# Patient Record
Sex: Female | Born: 1995 | Race: White | Hispanic: No | Marital: Single | Smoking: Never smoker
Health system: Southern US, Community
[De-identification: ages and names within clinical notes are randomized; demographics above are authoritative.]

## PROBLEM LIST (undated history)

## (undated) DIAGNOSIS — A281 Cat-scratch disease: Secondary | ICD-10-CM

## (undated) DIAGNOSIS — J45909 Unspecified asthma, uncomplicated: Secondary | ICD-10-CM

---
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
## 2016-10-23 IMAGING — CR DG CHEST 2V
1 series · 2 of 2 positions shown · non-contrast
Comparison: None.

CLINICAL DATA: Central chest pain

EXAM:
CHEST  2 VIEW

[Series 1: dg chest 2 view · 0.14mm/px · 2 of 2 slices shown]
[im 1/2]
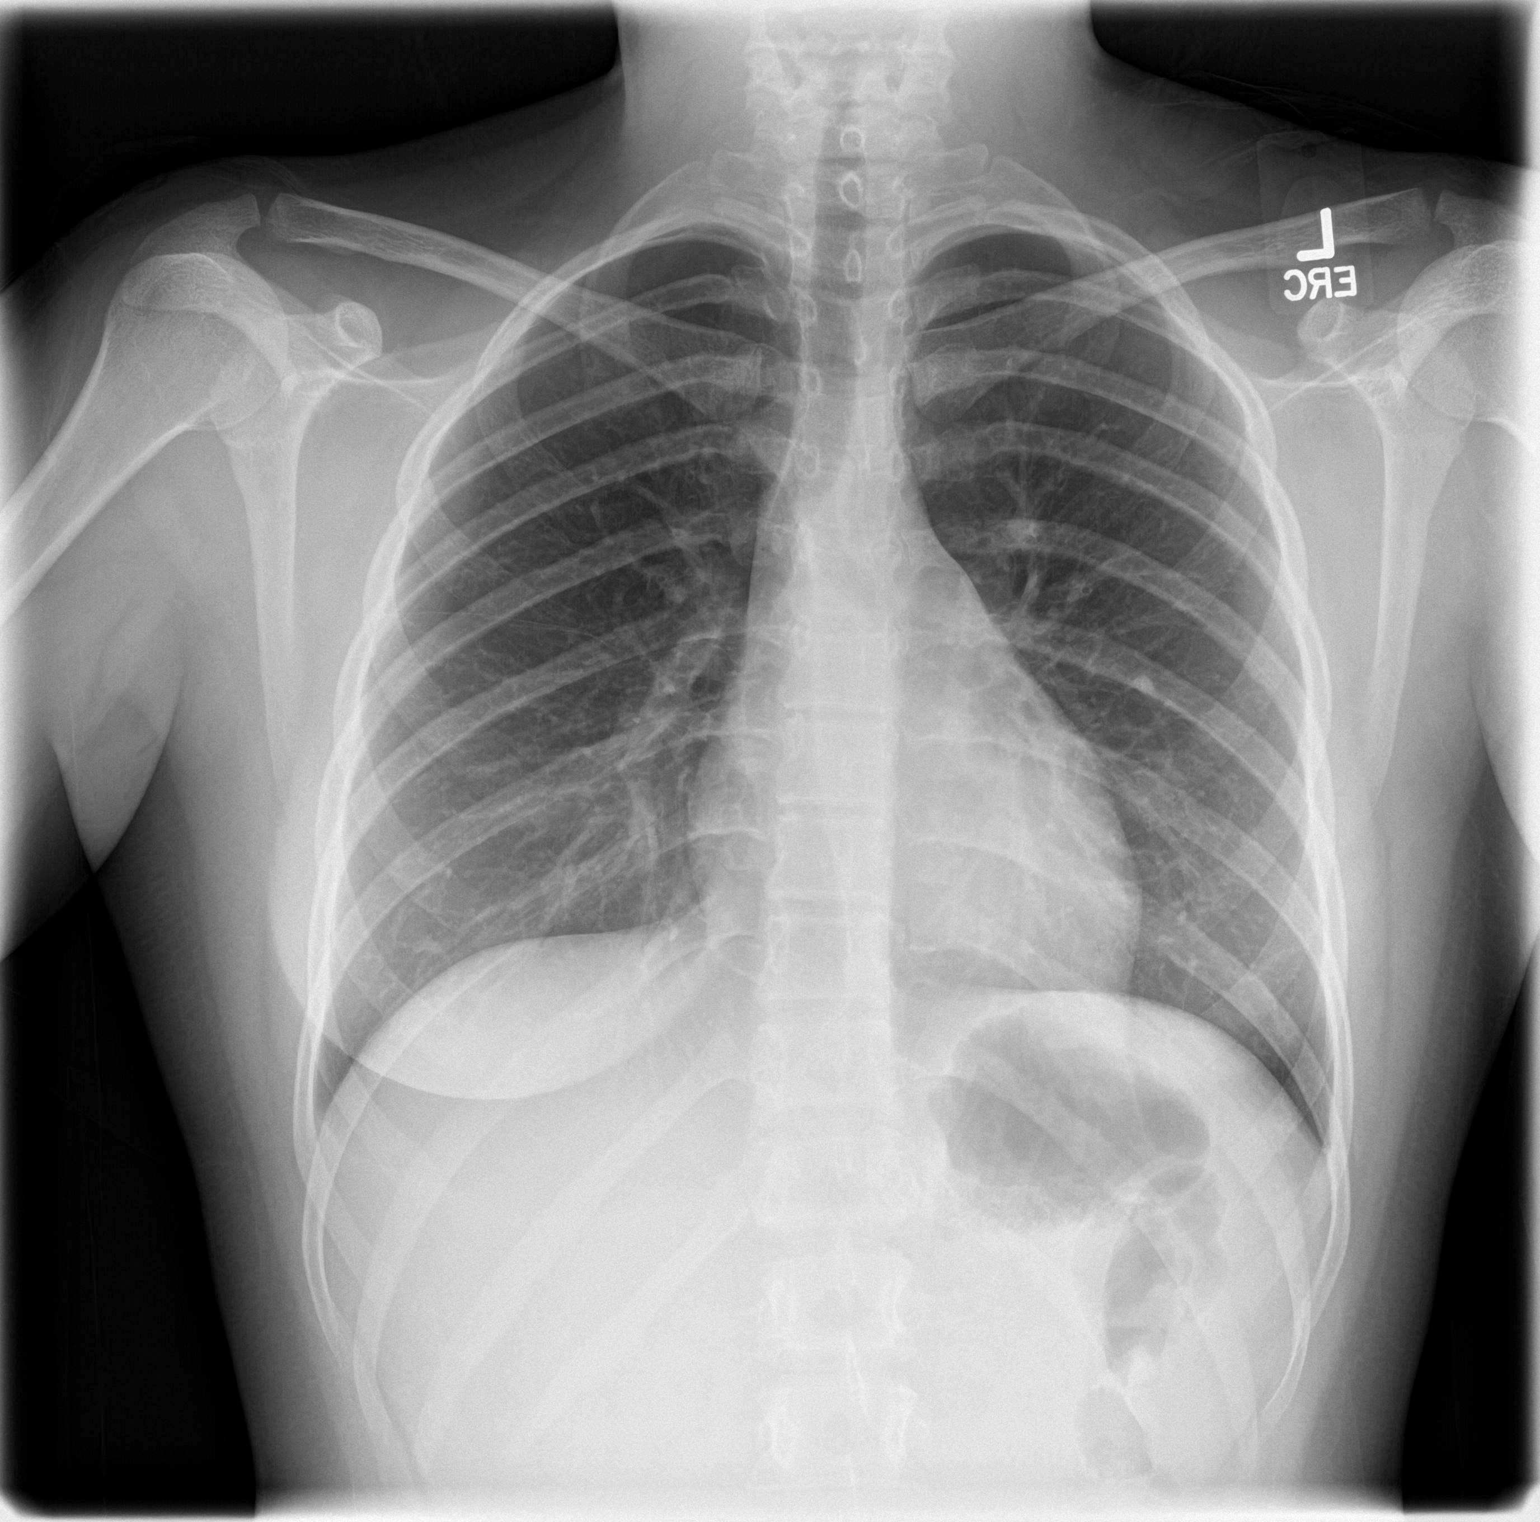
[im 2/2]
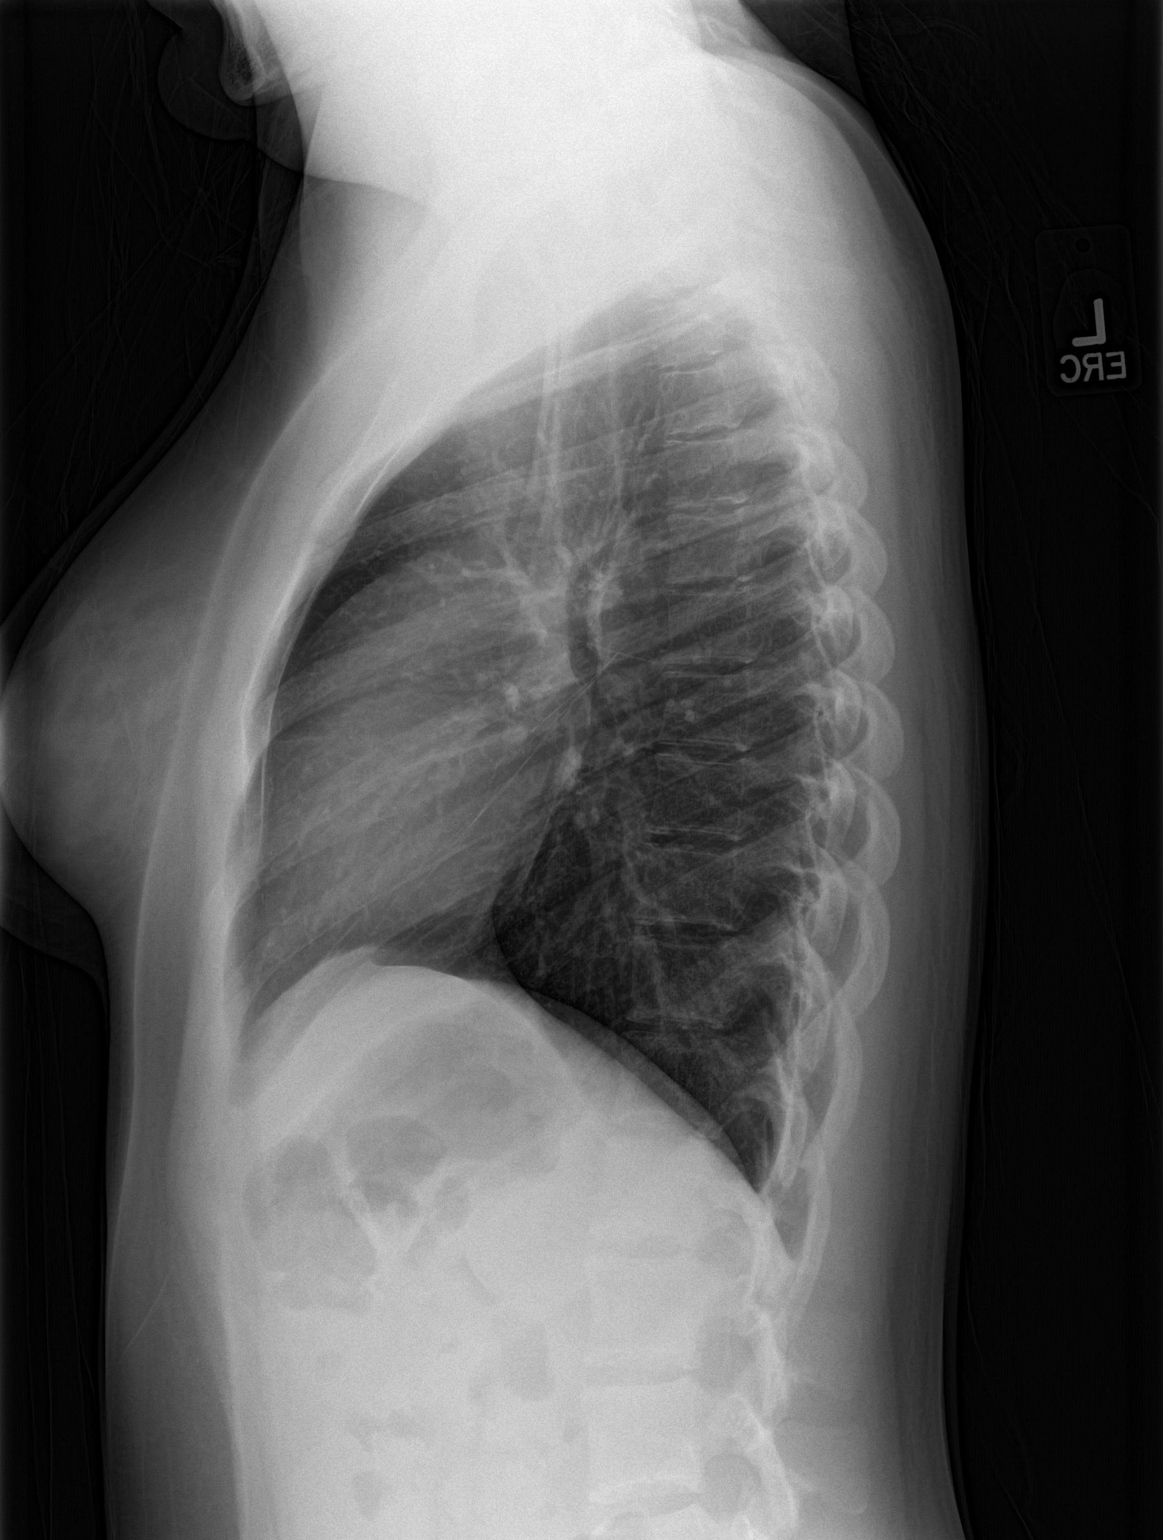

[2 of 2 positions shown; findings below may reference images not displayed]

FINDINGS: The heart size and mediastinal contours are within normal limits.
Both lungs are clear. The visualized skeletal structures are
unremarkable.
IMPRESSION: No active cardiopulmonary disease.
